# Patient Record
Sex: Female | Born: 1975 | Race: White | Hispanic: No
Health system: Midwestern US, Academic
[De-identification: ages and names within clinical notes are randomized; demographics above are authoritative.]

---

## 2018-10-12 ENCOUNTER — Encounter: Admit: 2018-10-12 | Discharge: 2018-10-12 | Payer: Medicaid Other

## 2018-11-04 ENCOUNTER — Encounter: Admit: 2018-11-04 | Discharge: 2018-11-04

## 2018-11-04 ENCOUNTER — Encounter: Admit: 2018-11-04 | Discharge: 2018-11-04 | Payer: MEDICAID

## 2018-11-09 ENCOUNTER — Encounter: Admit: 2018-11-09 | Discharge: 2018-11-09 | Payer: MEDICAID

## 2018-11-09 DIAGNOSIS — R000 Tachycardia, unspecified: Secondary | ICD-10-CM

## 2018-11-09 DIAGNOSIS — M549 Dorsalgia, unspecified: Secondary | ICD-10-CM

## 2018-11-09 DIAGNOSIS — I471 Supraventricular tachycardia: Secondary | ICD-10-CM

## 2018-11-09 DIAGNOSIS — K219 Gastro-esophageal reflux disease without esophagitis: Secondary | ICD-10-CM

## 2018-11-11 ENCOUNTER — Encounter: Admit: 2018-11-11 | Discharge: 2018-11-11 | Payer: MEDICAID

## 2018-11-13 ENCOUNTER — Encounter: Admit: 2018-11-13 | Discharge: 2018-11-13 | Payer: MEDICAID

## 2018-11-20 ENCOUNTER — Encounter: Admit: 2018-11-20 | Discharge: 2018-11-20 | Payer: MEDICAID

## 2019-01-15 ENCOUNTER — Encounter: Admit: 2019-01-15 | Discharge: 2019-01-15 | Payer: Medicaid Other

## 2019-01-15 DIAGNOSIS — I471 Supraventricular tachycardia: Principal | ICD-10-CM

## 2019-01-18 ENCOUNTER — Encounter: Admit: 2019-01-18 | Discharge: 2019-01-18 | Payer: Medicaid Other

## 2019-01-20 ENCOUNTER — Encounter: Admit: 2019-01-20 | Discharge: 2019-01-20 | Payer: MEDICAID

## 2019-02-19 ENCOUNTER — Ambulatory Visit: Admit: 2019-02-19 | Discharge: 2019-02-20 | Payer: MEDICAID

## 2019-02-19 ENCOUNTER — Encounter: Admit: 2019-02-19 | Discharge: 2019-02-19 | Payer: MEDICAID

## 2019-02-20 DIAGNOSIS — I471 Supraventricular tachycardia: Principal | ICD-10-CM

## 2019-02-24 ENCOUNTER — Encounter: Admit: 2019-02-24 | Discharge: 2019-02-24 | Payer: MEDICAID

## 2019-02-25 ENCOUNTER — Encounter: Admit: 2019-02-25 | Discharge: 2019-02-25 | Payer: MEDICAID

## 2019-02-26 ENCOUNTER — Encounter: Admit: 2019-02-26 | Discharge: 2019-02-26 | Payer: MEDICAID

## 2019-03-01 ENCOUNTER — Encounter: Admit: 2019-03-01 | Discharge: 2019-03-01 | Payer: MEDICAID

## 2019-03-01 ENCOUNTER — Ambulatory Visit: Admit: 2019-03-01 | Discharge: 2019-03-01 | Payer: MEDICAID

## 2019-03-01 DIAGNOSIS — K219 Gastro-esophageal reflux disease without esophagitis: ICD-10-CM

## 2019-03-01 DIAGNOSIS — I471 Supraventricular tachycardia: ICD-10-CM

## 2019-03-01 DIAGNOSIS — R000 Tachycardia, unspecified: ICD-10-CM

## 2019-03-01 DIAGNOSIS — M549 Dorsalgia, unspecified: Principal | ICD-10-CM

## 2019-03-01 DIAGNOSIS — Z9889 Other specified postprocedural states: ICD-10-CM

## 2019-03-01 MED ORDER — METOPROLOL SUCCINATE 25 MG PO TB24
25 mg | ORAL_TABLET | Freq: Every day | ORAL | 3 refills | 90.00000 days | Status: AC
Start: 2019-03-01 — End: ?

## 2019-03-01 NOTE — Progress Notes
Skin: Positive for dry skin.   Musculoskeletal: Positive for back pain.   Gastrointestinal: Negative.    Genitourinary: Negative.    Neurological: Negative.    Psychiatric/Behavioral: Negative.    Allergic/Immunologic: Negative.        Physical Exam   Constitutional: She appears well-developed and well-nourished. No distress.   Video exam during synchronized electronic visit   HENT:   Head: Normocephalic and atraumatic.   Mouth/Throat: Oropharynx is clear and moist. No oropharyngeal exudate.   Neck: Normal range of motion. No JVD present.   Pulmonary/Chest: Effort normal.   Musculoskeletal:         General: No edema.   Neurological: She is alert and oriented to person, place, and time. No cranial nerve deficit.   Psychiatric: She has a normal mood and affect. Her behavior is normal. Judgment normal.   Slightly disorganized though       SOCIAL HISTORY:  Katherine Roberts  reports that she has quit smoking. Her smoking use included cigarettes. She has never used smokeless tobacco. She reports current alcohol use. She reports that she does not use drugs.       FAMILY HISTORY:  Her She was adopted. Family history is unknown by patient.            Current Medications (including today's revisions)  ??? ARIPiprazole (ABILIFY) 15 mg tablet Take 15 mg by mouth daily.   ??? metoprolol XL (TOPROL XL) 25 mg extended release tablet Take one tablet by mouth daily.

## 2019-03-10 ENCOUNTER — Encounter: Admit: 2019-03-10 | Discharge: 2019-03-10 | Payer: MEDICAID

## 2019-06-21 ENCOUNTER — Encounter: Admit: 2019-06-21 | Discharge: 2019-06-21

## 2020-01-13 ENCOUNTER — Ambulatory Visit: Admit: 2020-01-13 | Discharge: 2020-01-13 | Payer: MEDICAID

## 2020-01-13 ENCOUNTER — Encounter: Admit: 2020-01-13 | Discharge: 2020-01-13 | Payer: MEDICAID

## 2020-01-13 DIAGNOSIS — I471 Supraventricular tachycardia: Secondary | ICD-10-CM

## 2020-01-13 NOTE — Progress Notes
CARDIOLOGY CONSULT    Name: Katherine Roberts   DOB: 1976/05/03  MRN: [id number]         Assessment:   1.  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  2.  Dyspnea on exertion which has been progressive and associated with edema, concerning for heart failure with preserved ejection fraction.  Echocardiogram performed today with normal EF 65%, diastolic function not fully assessed, IVC and right sided chambers not completely seen.  Clinically, she does not appear volume overloaded.  3.  Obesity  4.  Tobacco abuse  5.  Microcytic anemia    Katherine Roberts is a 44 y.o.female admitted for palpitations found to be in SVT which responded to adenosine at the [facility] in [place].    Recommendations:   We will have her be more consistent on metoprolol use, should take prescribed dose of 25 mg Toprol-XL, and this was reviewed with the patient.  This can be further titrated in clinic overseen by Dr. Milas Kocher, and if she continues to have supraventricular arrhythmias, he can consider additional antiarrhythmics such as propafenone versus repeat SVT ablation.    We discussed cardiovascular risk factors including obesity and tobacco use.  She will continue to work on these from a disease management and prevention standpoint with her primary care physician.    Echocardiogram today could be consistent with HFpEF, poor windows led to incomplete assessment particularly for right-sided dysfunction.  We will continue ongoing medical management of her comorbid conditions with reassessment in clinic.    Evaluation of microcytic anemia per primary team, likely GYN evaluation outpatient could be appropriate as she notes heavy periods.    Thank you for allowing me to participate in the care of this patient.  Please do not hesitate to contact me should you have any questions or concerns.   We will sign off for now.?    Baldo Ash, MD, PhD Staff Cardiologist  Pager [phone]  Date of Service:  01/13/2020      HPI and Hospital Course:     This is a pleasant 44 year old lady known to our EP clinic as she follows with Dr. Milas Kocher for SVT.  She presented to the [facility] emergency room with palpitations, shortness of breath, and lightheadedness with associated chest discomfort beginning 2 hours prior to ER assessment, attributed to another spell of paroxysmal SVT.  Previously a few weeks ago, she did have luck with vagal maneuver a few weeks ago when a similar episode occurred, but this time despite efforts to slow her heart rate on her own, it persisted and she required assessment in the emergency room.  While no EKG captured the rhythm, there is a very nice waveform report with the lead to tracing demonstrating clear narrow complex tachycardia around 180 bpm, broken by adenosine, 5 nonconductive P waves follows, then resumption of sinus rhythm around 100 bpm.    ROS  She has heavy menses, and a 12 system review was otherwise negative except as per HPI.      Medical History:   Diagnosis Date   ? Back pain 11/09/2018   ? Chronic GERD 11/09/2018   ? 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 Tachycardia 11/09/2018     No past surgical history on file.  Family History   Adopted: Yes   Family history unknown: Yes     Social History     Socioeconomic History   ?  Marital status: Divorced     Spouse name: Not on file   ? Number of children: Not on file   ? Years of education: Not on file   ? Highest education level: Not on file   Occupational History   ? Not on file   Tobacco Use   ? Smoking status: Former Smoker     Types: Cigarettes   ? Smokeless tobacco: Never Used   Substance and Sexual Activity   ? Alcohol use: Yes     Comment: rarely   ? Drug use: Never   ? Sexual activity: Not on file   Other Topics Concern   ? Not on file   Social History Narrative   ? Not on file     Current Outpatient Medications on File Prior to Visit   Medication Sig Dispense Refill   ? ARIPiprazole (ABILIFY) 15 mg tablet Take 15 mg by mouth daily.     ? metoprolol XL (TOPROL XL) 25 mg extended release tablet Take one tablet by mouth daily. 90 tablet 3     No current facility-administered medications on file prior to visit.      No Known Allergies      Objective:     PHYSICAL EXAM    GEN: AAO in NAD, comfortable  HEENT: MMM with anicteric sclera  Neck: JVP <7cmH2O, normal carotid pulses  CV: RRR normal S1, S2, no M/R/G  Lungs: CTAB  Abd: S/NT/ND with normoactive bowel sounds  Ext: Warm, no Edema  Neuro: AAOx4, no focal deficits  Psych: appropriate and cooperative      Echocardiogram Details:  Echo Results  (Last 3 results in the past 3 years)    Echo EF LVIDD LA Size IVS LVPW Rest PAP    (01/13/20)  65 (01/13/20)  3.5 (01/13/20)  3.1 (01/13/20)  0.9 (01/13/20)  0.9 --         Admission labs: CBC: WBC 9.3, hemoglobin 9.5, platelets 376  Chemistry: Sodium 141, potassium 4, chloride 106, bicarb 24, BUN 10, creatinine 0.84, glucose 76.  INR 1, extended chemistry notable for AST slightly elevated at 36, troponin negative,

## 2020-01-14 ENCOUNTER — Encounter: Admit: 2020-01-14 | Discharge: 2020-01-14 | Payer: MEDICAID

## 2021-01-30 IMAGING — US ECHOCOMPL
1 series · 12 of 24 positions shown · non-contrast
Comparison: none

[Series 1: us echo 2d, wo/w m-mode, compl · 88 acquisitions, 12 frames shown]
[im 4/88]
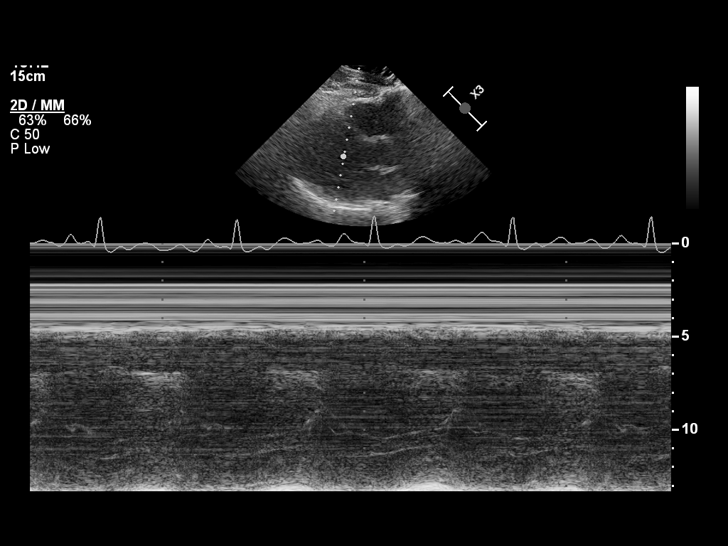
[im 12/88]
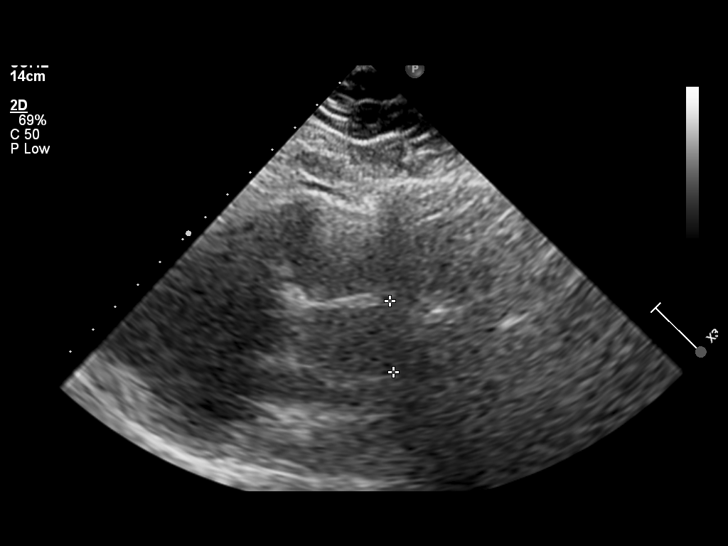
[im 19/88]
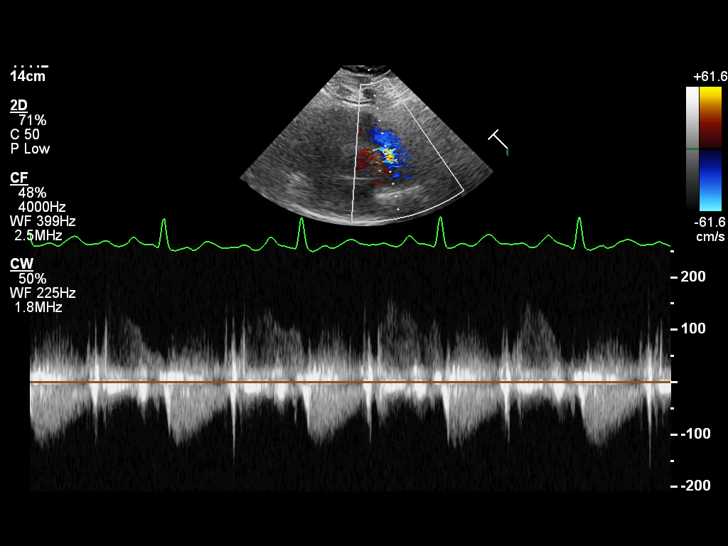
[im 27/88]
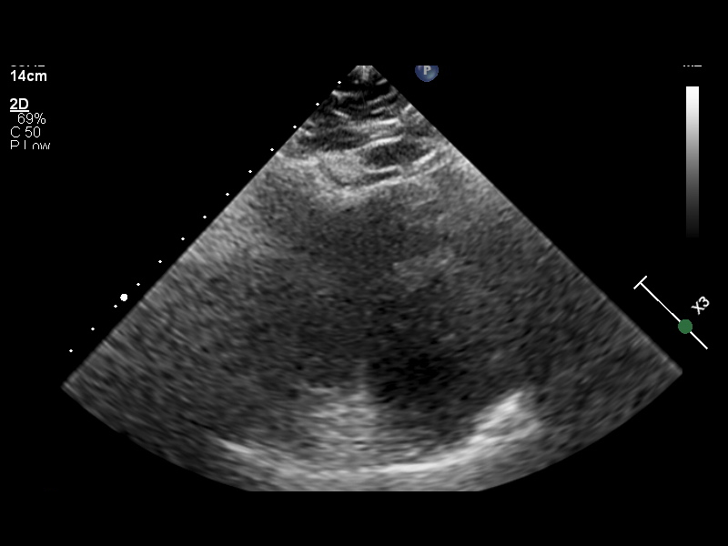
[im 38/88]
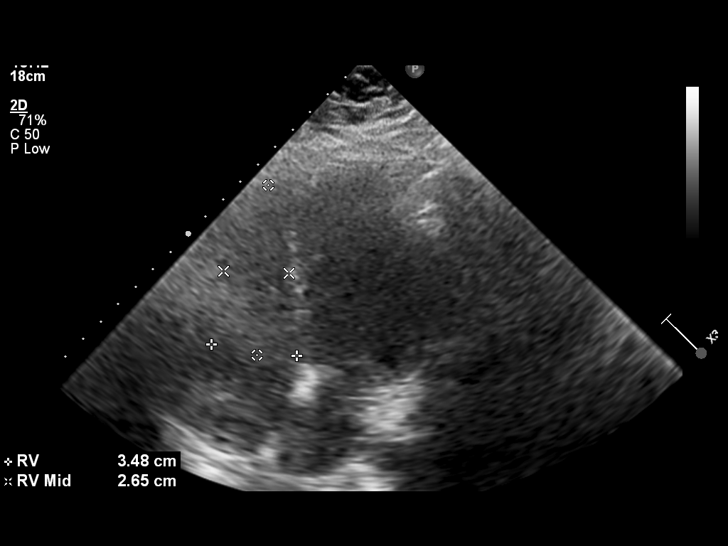
[im 46/88]
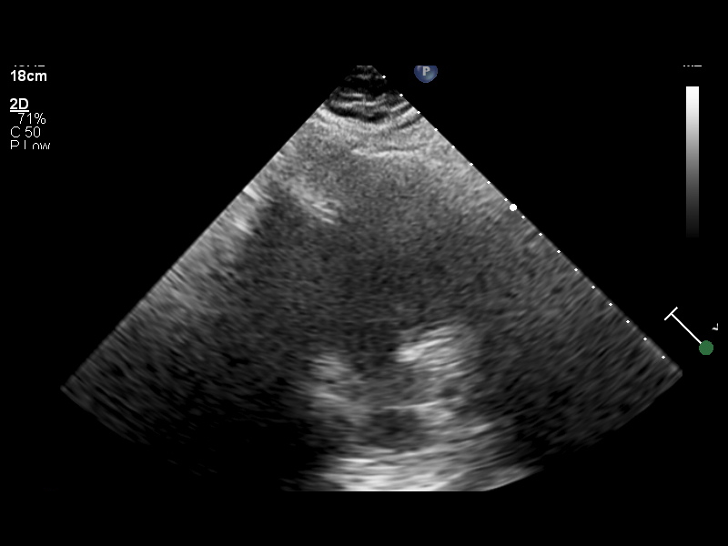
[im 53/88]
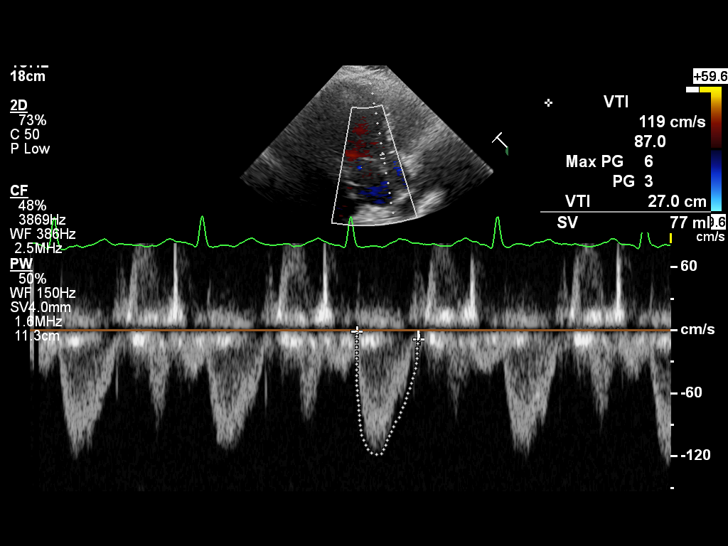
[im 61/88]
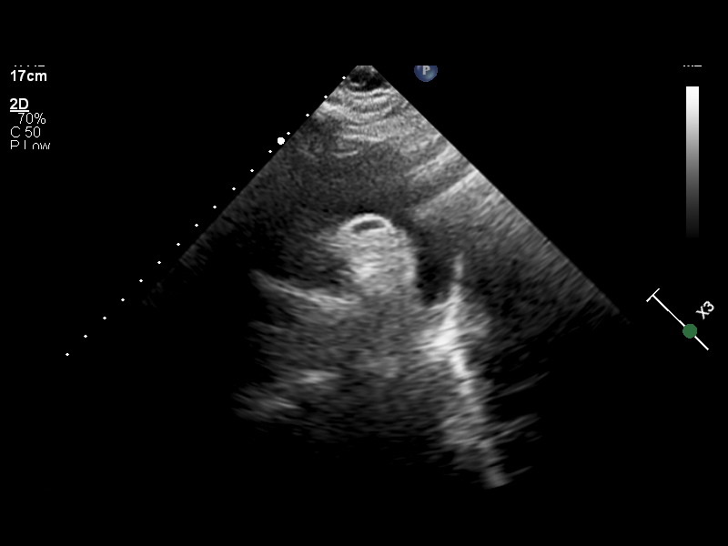
[im 69/88]
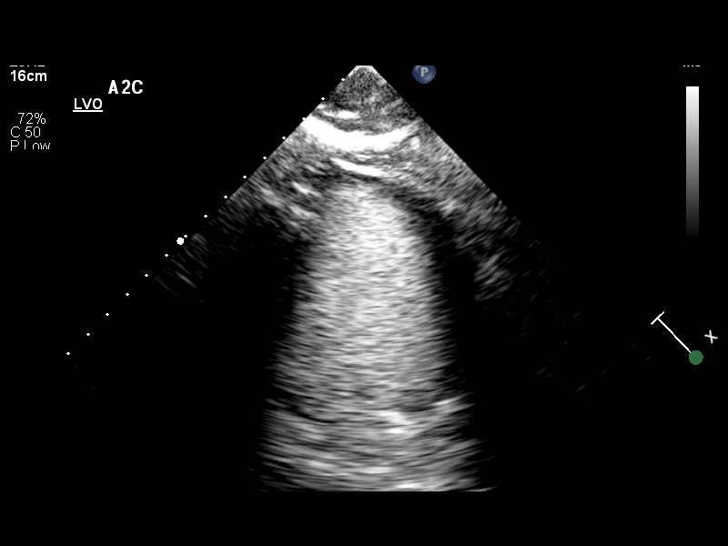
[im 72/88]
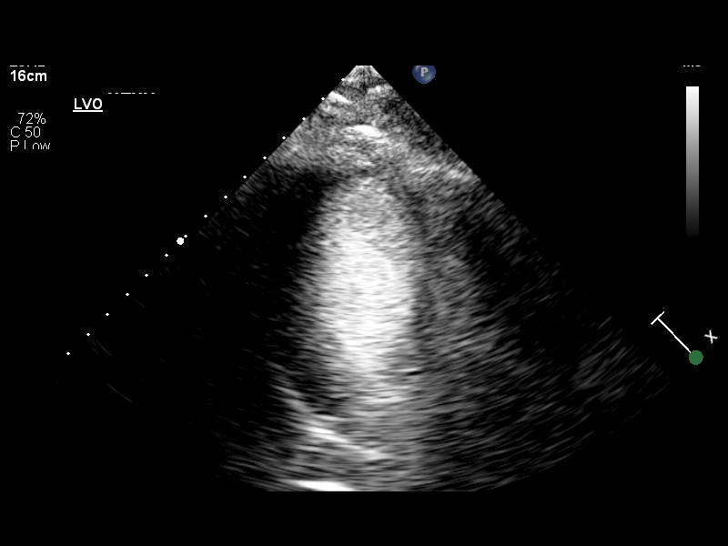
[im 80/88]
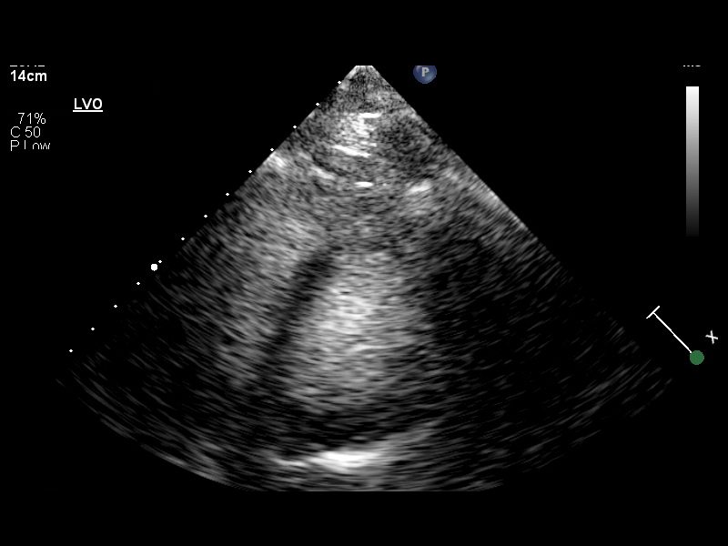
[im 88/88]
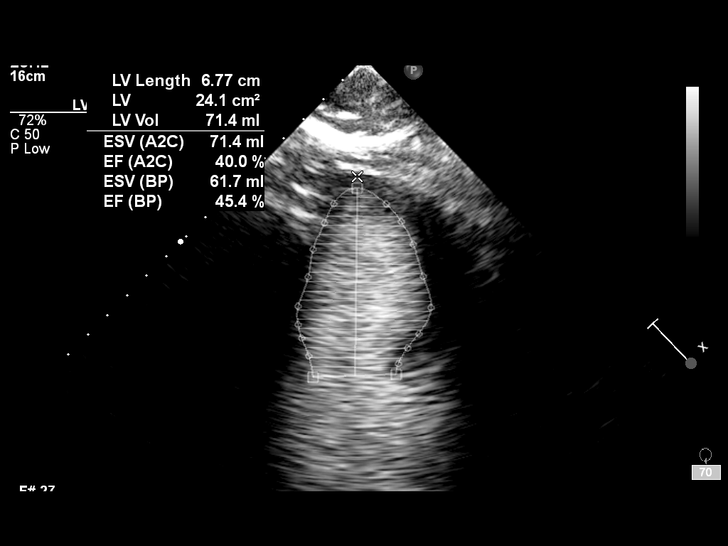

[12 of 24 positions shown; findings below may reference images not displayed]

01/13/20 -  2D + DOPPLER ECHO
Location Performed: [HOSPITAL]

Referring Provider: Dleke
Lesram:
[facility] of Interp: [facility] [[facility]ACTED]

Indications: Supraventricular tachycardia

Technically difficult study with poor endocardial visualization.

Vitals
Height   Weight   BSA (Calculated)   BP   Comments
162.6 cm (64")   108.4 kg (239 lb)   2.21   125/70

Interpretation Summary
Technically difficult study with poor endocardial definition.
LVEF=65%.
Normal left ventricular size and systolic function.
No definite regional wall abnormalities identified on the echo contrast images.
The RV size and function are probably normal.
Normal LA size.
Valve structures not well visualized. No significant stenosis or regurgitation by Doppler studies.
No pericardial effusion.
Unable to accurately estimate PA systolic pressure.

There are no previous studies available for comparison.

Echocardiographic Findings
Left Ventricle   The left ventricular size, wall thickness and systolic function are normal. The
visually estimated ejection fraction is 65%. Unable to assess left ventricular diastolic function.
Unable to assess left atrial pressure.
Right Ventricle   Ventricle not well seen. The right ventricle is probably normal in size. The right
ventricular systolic function is probably normal. The pulmonary artery pressure could not be
estimated due to inadequate tricuspid regurgitation signal. TAPSE is 2.8 cm.
Left Atrium   Not well seen. Normal size.
Right Atrium   Not well seen.
IVC/SVC   Inferior vena cava was not well seen; right atrial pressure could not be estimated.
Mitral Valve   Trace regurgitation.
Tricuspid Valve   The tricuspid valve was not well seen. No stenosis. Trace regurgitation.
Aortic Valve   The aortic valve was not well seen. No stenosis. No regurgitation.
Pulmonary   The pulmonic valve was not well seen. No stenosis. Trace regurgitation.
The pulmonary artery was not well seen.
Aorta   The aortic root diameter is normal. Remainder of proximal ascending aorta not well seen.
Pericardium   No pericardial effusion.

Left Ventricular Wall Scoring
Score Index: 1.000   Percent Normal: 100.0%

The left ventricular wall motion is normal.

Left Heart 2D Measurements (Normal Ranges)
EF (Visual)
65 %
LVIDD
3.5 cm (Range: 3.8 - 5.2)
LVIDS
2.0 cm (Range: 2.2 - 3.5)
IVS
0.9 cm (Range: 0.6 - 0.9)
LV PW
0.9 cm (Range: 0.6 - 0.9)
LA Size
3.1 cm (Range: 2.7 - 3.8)

Right Heart 2D   M-Mode Measurements (Normal Ranges) (Range)
RV Basal Dia
3.5 cm (2.5 - 4.1)
RV Mid Dia
2.7 cm (1.9 - 3.5)
M-Mode TAPSE
2.8 cm (>1.7)

Left Heart 2D Addnl Measurements (Normal Ranges)
LV Systolic Vol
22 mL (Range: 14 - 42)
LV Systolic Vol Index
10 mL (Range: 8 - 24)
LA Vol
46 mL (Range: 22 - 52)
LA Vol Index
20.81 (Range: 16 - 34)
LV Mass
89 g (Range: 67 - 162)
LV Mass Index
40 g/m2 (Range: 43 - 95)
RWT
0.51 (Range: <=0.42)

Aortic Root Measurements (Normal Ranges)
Sinus
2.6 cm (Range: 2.4 - 3.6)

Doppler (Spectral and Color Flow)
Aortic valve peak velocity
1.2 m/s

Tech Notes:

JL. [employer] lot #[id number]
